# Patient Record
Sex: Female | Born: 1990 | Race: White | Hispanic: No | Marital: Single
Health system: Southern US, Community
[De-identification: ages and names within clinical notes are randomized; demographics above are authoritative.]

## PROBLEM LIST (undated history)

## (undated) ENCOUNTER — Inpatient Hospital Stay (HOSPITAL_COMMUNITY): Payer: Self-pay

## (undated) DIAGNOSIS — B1920 Unspecified viral hepatitis C without hepatic coma: Secondary | ICD-10-CM

---

## 2016-04-30 ENCOUNTER — Inpatient Hospital Stay (HOSPITAL_COMMUNITY)
Admission: AD | Admit: 2016-04-30 | Discharge: 2016-05-01 | Disposition: A | Discharge status: Home / Self Care | Payer: Self-pay | Source: Ambulatory Visit | Attending: Obstetrics & Gynecology | Admitting: Obstetrics & Gynecology

## 2016-04-30 ENCOUNTER — Encounter (HOSPITAL_COMMUNITY): Payer: Self-pay

## 2016-04-30 DIAGNOSIS — N390 Urinary tract infection, site not specified: Secondary | ICD-10-CM

## 2016-04-30 DIAGNOSIS — O4692 Antepartum hemorrhage, unspecified, second trimester: Secondary | ICD-10-CM

## 2016-04-30 DIAGNOSIS — Z3492 Encounter for supervision of normal pregnancy, unspecified, second trimester: Secondary | ICD-10-CM

## 2016-04-30 DIAGNOSIS — F1910 Other psychoactive substance abuse, uncomplicated: Secondary | ICD-10-CM

## 2016-04-30 DIAGNOSIS — Z3491 Encounter for supervision of normal pregnancy, unspecified, first trimester: Secondary | ICD-10-CM

## 2016-04-30 DIAGNOSIS — O209 Hemorrhage in early pregnancy, unspecified: Secondary | ICD-10-CM

## 2016-04-30 DIAGNOSIS — O039 Complete or unspecified spontaneous abortion without complication: Secondary | ICD-10-CM

## 2016-04-30 LAB — POCT PREGNANCY, URINE: Preg Test, Ur: POSITIVE — AB

## 2016-04-30 NOTE — MAU Provider Note (Signed)
History     CSN: [id number]  Arrival date and time: 04/30/16 2340   First Provider Initiated Contact with Patient 04/30/16 2353      Chief Complaint  Patient presents with  . Vaginal Bleeding  . Abdominal Pain   Vaginal Bleeding The patient's primary symptoms include pelvic pain and vaginal bleeding. This is a new problem. The current episode started today. The problem has been gradually worsening. The pain is severe ("12/10"). The problem affects both sides. She is pregnant. Associated symptoms include abdominal pain. Pertinent negatives include no chills, constipation, diarrhea, dysuria, fever, frequency, nausea, urgency or vomiting. The vaginal discharge was bloody. The vaginal bleeding is heavier than menses. She has been passing clots. She has not been passing tissue. Nothing aggravates the symptoms. She has tried nothing for the symptoms. She uses nothing for contraception. Menstrual history: LMP 12/16/15     History reviewed. No pertinent past medical history.  History reviewed. No pertinent past surgical history.  History reviewed. No pertinent family history.  Social History  Substance Use Topics  . Smoking status: None  . Smokeless tobacco: None  . Alcohol Use: None    Allergies: No Known Allergies  No prescriptions prior to admission    Review of Systems  Constitutional: Negative for fever and chills.  Gastrointestinal: Positive for abdominal pain. Negative for nausea, vomiting, diarrhea and constipation.  Genitourinary: Positive for vaginal bleeding and pelvic pain. Negative for dysuria, urgency and frequency.   Physical Exam   Blood pressure 134/81, pulse 62, temperature 98 F (36.7 C), temperature source Oral, resp. rate 20, last menstrual period 12/16/2015, SpO2 100 %.  Physical Exam  Nursing note and vitals reviewed. Constitutional: She is oriented to person, place, and time. She appears well-developed and well-nourished. No distress.  HENT:  Head:  Normocephalic.  Cardiovascular: Normal rate.   Respiratory: Effort normal.  GI: There is no tenderness. There is no rebound.  Genitourinary:   External: no lesion Vagina: large amount of clot in the vagina Cervix: 3/90/BBOW Uterus: AGA  Neurological: She is alert and oriented to person, place, and time.  Skin: Skin is warm and dry.  Psychiatric: She has a normal mood and affect.   [place] Ob Comp Less 14 Wks  05/01/2016  CLINICAL DATA:  First trimester vaginal bleeding. Unsure dates. Negative fetal heart tones. Estimated gestational age by LMP is 19 weeks 4 days. Quantitative beta HCG is pending. EXAM: OBSTETRIC <14 WK US AND TRANSVAGINAL OB US TECHNIQUE: Both transabdominal and transvaginal ultrasound examinations were performed for complete evaluation of the gestation as well as the maternal uterus, adnexal regions, and pelvic cul-de-sac. Transvaginal technique was performed to assess early pregnancy. COMPARISON:  None. FINDINGS: Intrauterine gestational sac: A single intrauterine pregnancy is identified. Yolk sac:  Yolk sac is not visualized. Embryo: Fetal pole is present. Fetal pole appears abnormal with low-attenuation consistent with edema. Amniotic fluid volume is subjectively decreased. Fetus is oriented in the lower uterine segment extending into the cervical canal. Fetal measurements include biparietal diameter of 1.8 cm, head circumference 7.3 cm, femur length 1.2 cm. Based on these measurements, mean gestational age is estimated at 13 weeks 2 days. Cardiac Activity: No fetal cardiac activity is identified. Heart Rate: 0  bpm Maternal uterus/adnexae: Prominent endometrium with fluid and echogenic material present. Right ovary is visualized and appears normal. Left ovary is not visualized. No abnormal adnexal masses. No free fluid identified. IMPRESSION: Single intrauterine pregnancy. Foetal measurements are consistent with estimated gestational age at 6713 weeks  2 days, small for clinical dates.  Fetus is localized to the lower uterine segment and endocervical canal. No fetal motion or cardiac activity are visualized. Appearance is consistent with intrauterine fetal demise. Electronically Signed   By: Burman Nieves M.D.   On: 05/01/2016 01:33   Results for orders placed or performed during the hospital encounter of 04/30/16 (from the past 24 hour(s))  Urinalysis, Routine w reflex microscopic (not at [facility])     Status: Abnormal   Collection Time: 04/30/16 11:40 PM  Result Value Ref Range   Color, Urine YELLOW YELLOW   APPearance CLEAR CLEAR   Specific Gravity, Urine 1.020 1.005 - 1.030   pH 7.5 5.0 - 8.0   Glucose, UA NEGATIVE NEGATIVE mg/dL   Hgb urine dipstick LARGE (A) NEGATIVE   Bilirubin Urine NEGATIVE NEGATIVE   Ketones, ur NEGATIVE NEGATIVE mg/dL   Protein, ur NEGATIVE NEGATIVE mg/dL   Nitrite POSITIVE (A) NEGATIVE   Leukocytes, UA TRACE (A) NEGATIVE  Urine rapid drug screen (hosp performed)     Status: Abnormal   Collection Time: 04/30/16 11:40 PM  Result Value Ref Range   Opiates POSITIVE (A) NONE DETECTED   Cocaine POSITIVE (A) NONE DETECTED   Benzodiazepines NONE DETECTED NONE DETECTED   Amphetamines NONE DETECTED NONE DETECTED   Tetrahydrocannabinol POSITIVE (A) NONE DETECTED   Barbiturates NONE DETECTED NONE DETECTED  Urine microscopic-add on     Status: Abnormal   Collection Time: 04/30/16 11:40 PM  Result Value Ref Range   Squamous Epithelial / LPF 0-5 (A) NONE SEEN   WBC, UA 0-5 0 - 5 WBC/hpf   RBC / HPF 6-30 0 - 5 RBC/hpf   Bacteria, UA MANY (A) NONE SEEN  Pregnancy, urine POC     Status: Abnormal   Collection Time: 04/30/16 11:53 PM  Result Value Ref Range   Preg Test, Ur POSITIVE (A) NEGATIVE  CBC     Status: Abnormal   Collection Time: 05/01/16  3:37 AM  Result Value Ref Range   WBC 11.0 (H) 4.0 - 10.5 K/uL   RBC 3.92 3.87 - 5.11 MIL/uL   Hemoglobin 11.6 (L) 12.0 - 15.0 g/dL   HCT 40.9 (L) 81.1 - 91.4 %   MCV 86.2 78.0 - 100.0 fL   MCH 29.6  26.0 - 34.0 pg   MCHC 34.3 30.0 - 36.0 g/dL   RDW 78.2 95.6 - 21.3 %   Platelets 325 150 - 400 K/uL  ABO/Rh     Status: None (Preliminary result)   Collection Time: 05/01/16  3:37 AM  Result Value Ref Range   ABO/RH(D) O POS     MAU Course  Procedures  MDM 0865: D/W Dr. Despina Hidden, will give of cytotec buccally here in MAU.   0313: Patient has had cytotec.Pelvic exam repeated: External: no lesion Vagina: large amount of tissue and clots removed from the vagina.  Cervix: pink, smooth, once tissue removed bleeding was very light.  Patient is agreeing to having her labs done now.  Tissue sent to pathology.   7846: Patient is O+. Given a dose of toradol prior to dc home.    Assessment and Plan   1. Vaginal bleeding in pregnancy, first trimester   2. Second trimester bleeding   3. Pregnancy with uncertain dates, second trimester   4. Uncertain dates, antepartum, first trimester   5. Substance abuse   6. UTI (lower urinary tract infection)   7. SAB (spontaneous abortion)    DC home Comfort measures  reviewed  Bleeding precautions RX: macrobid bid #[id number]  Return to MAU as needed   Follow-up Information    Follow up with [facility].   Specialty:  Obstetrics and Gynecology   Why:  They will call you with an appointment   Contact information:   [address] [place] [address] [phone]        Tawnya Crook 04/30/2016, 11:54 PM

## 2016-04-30 NOTE — MAU Note (Signed)
Pt presents vis EMS complaining of heavy vaginal bleeding and abdominal pain. States she started spotting a week ago and got heavy tonight. LMP end of february

## 2016-05-01 ENCOUNTER — Encounter (HOSPITAL_COMMUNITY): Payer: Self-pay | Admitting: Anesthesiology

## 2016-05-01 ENCOUNTER — Inpatient Hospital Stay (HOSPITAL_COMMUNITY): Payer: Self-pay

## 2016-05-01 ENCOUNTER — Encounter (HOSPITAL_COMMUNITY): Payer: Self-pay

## 2016-05-01 DIAGNOSIS — O4691 Antepartum hemorrhage, unspecified, first trimester: Secondary | ICD-10-CM

## 2016-05-01 LAB — URINALYSIS, ROUTINE W REFLEX MICROSCOPIC
BILIRUBIN URINE: NEGATIVE
GLUCOSE, UA: NEGATIVE mg/dL
Ketones, ur: NEGATIVE mg/dL
Nitrite: POSITIVE — AB
PROTEIN: NEGATIVE mg/dL
Specific Gravity, Urine: 1.02 (ref 1.005–1.030)
pH: 7.5 (ref 5.0–8.0)

## 2016-05-01 LAB — CBC
HCT: 33.8 % — ABNORMAL LOW (ref 36.0–46.0)
HEMOGLOBIN: 11.6 g/dL — AB (ref 12.0–15.0)
MCH: 29.6 pg (ref 26.0–34.0)
MCHC: 34.3 g/dL (ref 30.0–36.0)
MCV: 86.2 fL (ref 78.0–100.0)
PLATELETS: 325 10*3/uL (ref 150–400)
RBC: 3.92 MIL/uL (ref 3.87–5.11)
RDW: 14.7 % (ref 11.5–15.5)
WBC: 11 10*3/uL — AB (ref 4.0–10.5)

## 2016-05-01 LAB — URINE MICROSCOPIC-ADD ON

## 2016-05-01 LAB — RAPID URINE DRUG SCREEN, HOSP PERFORMED
AMPHETAMINES: NOT DETECTED
Barbiturates: NOT DETECTED
Benzodiazepines: NOT DETECTED
Cocaine: POSITIVE — AB
OPIATES: POSITIVE — AB
Tetrahydrocannabinol: POSITIVE — AB

## 2016-05-01 LAB — ABO/RH: ABO/RH(D): O POS

## 2016-05-01 LAB — HCG, QUANTITATIVE, PREGNANCY: hCG, Beta Chain, Quant, S: 418 m[IU]/mL — ABNORMAL HIGH (ref <5)

## 2016-05-01 MED ORDER — NITROFURANTOIN MONOHYD MACRO 100 MG PO CAPS: 100.0000 mg | ORAL_CAPSULE | Freq: Two times a day (BID) | ORAL | Status: AC

## 2016-05-01 MED ORDER — KETOROLAC TROMETHAMINE 60 MG/2ML IM SOLN
60.0000 mg | Freq: Once | INTRAMUSCULAR | Status: AC
  Administered 2016-05-01: 60 mg via INTRAMUSCULAR
  Filled 2016-05-01: qty 2

## 2016-05-01 MED ORDER — HYDROMORPHONE HCL 1 MG/ML IJ SOLN
2.0000 mg | Freq: Once | INTRAMUSCULAR | Status: AC
  Administered 2016-05-01: 2 mg via INTRAMUSCULAR
  Filled 2016-05-01 (×2): qty 2

## 2016-05-01 MED ORDER — MISOPROSTOL 200 MCG PO TABS
800.0000 ug | ORAL_TABLET | Freq: Once | ORAL | Status: AC
  Administered 2016-05-01: 800 ug via BUCCAL
  Filled 2016-05-01: qty 4

## 2016-05-01 NOTE — Discharge Instructions (Signed)
Urinary Tract Infection Urinary tract infections (UTIs) can develop anywhere along your urinary tract. Your urinary tract is your body's drainage system for removing wastes and extra water. Your urinary tract includes two kidneys, two ureters, a bladder, and a urethra. Your kidneys are a pair of bean-shaped organs. Each kidney is about the size of your fist. They are located below your ribs, one on each side of your spine. CAUSES Infections are caused by microbes, which are microscopic organisms, including fungi, viruses, and bacteria. These organisms are so small that they can only be seen through a microscope. Bacteria are the microbes that most commonly cause UTIs. SYMPTOMS  Symptoms of UTIs may vary by age and gender of the patient and by the location of the infection. Symptoms in young women typically include a frequent and intense urge to urinate and a painful, burning feeling in the bladder or urethra during urination. Older women and men are more likely to be tired, shaky, and weak and have muscle aches and abdominal pain. A fever may mean the infection is in your kidneys. Other symptoms of a kidney infection include pain in your back or sides below the ribs, nausea, and vomiting. DIAGNOSIS To diagnose a UTI, your caregiver will ask you about your symptoms. Your caregiver will also ask you to provide a urine sample. The urine sample will be tested for bacteria and white blood cells. White blood cells are made by your body to help fight infection. TREATMENT  Typically, UTIs can be treated with medication. Because most UTIs are caused by a bacterial infection, they usually can be treated with the use of antibiotics. The choice of antibiotic and length of treatment depend on your symptoms and the type of bacteria causing your infection. HOME CARE INSTRUCTIONS  If you were prescribed antibiotics, take them exactly as your caregiver instructs you. Finish the medication even if you feel better after  you have only taken some of the medication.  Drink enough water and fluids to keep your urine clear or pale yellow.  Avoid caffeine, tea, and carbonated beverages. They tend to irritate your bladder.  Empty your bladder often. Avoid holding urine for long periods of time.  Empty your bladder before and after sexual intercourse.  After a bowel movement, women should cleanse from front to back. Use each tissue only once. SEEK MEDICAL CARE IF:   You have back pain.  You develop a fever.  Your symptoms do not begin to resolve within 3 days. SEEK IMMEDIATE MEDICAL CARE IF:   You have severe back pain or lower abdominal pain.  You develop chills.  You have nausea or vomiting.  You have continued burning or discomfort with urination. MAKE SURE YOU:   Understand these instructions.  Will watch your condition.  Will get help right away if you are not doing well or get worse.   This information is not intended to replace advice given to you by your health care provider. Make sure you discuss any questions you have with your health care provider.   Document Released: 07/16/2005 Document Revised: 06/27/2015 Document Reviewed: 11/14/2011 Elsevier Interactive Patient Education [employer]  Miscarriage A miscarriage is the sudden loss of an unborn baby (fetus) before the 20th week of pregnancy. Most miscarriages happen in the first 3 months of pregnancy. Sometimes, it happens before a woman even knows she is pregnant. A miscarriage is also called a "spontaneous miscarriage" or "early pregnancy loss." Having a miscarriage can be an emotional experience. Talk  with your caregiver about any questions you may have about miscarrying, the grieving process, and your future pregnancy plans. CAUSES   Problems with the fetal chromosomes that make it impossible for the baby to develop normally. Problems with the baby's genes or chromosomes are most often the result of errors that occur, by  chance, as the embryo divides and grows. The problems are not inherited from the parents.  Infection of the cervix or uterus.   Hormone problems.   Problems with the cervix, such as having an incompetent cervix. This is when the tissue in the cervix is not strong enough to hold the pregnancy.   Problems with the uterus, such as an abnormally shaped uterus, uterine fibroids, or congenital abnormalities.   Certain medical conditions.   Smoking, drinking alcohol, or taking illegal drugs.   Trauma.  Often, the cause of a miscarriage is unknown.  SYMPTOMS   Vaginal bleeding or spotting, with or without cramps or pain.  Pain or cramping in the abdomen or lower back.  Passing fluid, tissue, or blood clots from the vagina. DIAGNOSIS  Your caregiver will perform a physical exam. You may also have an ultrasound to confirm the miscarriage. Blood or urine tests may also be ordered. TREATMENT   Sometimes, treatment is not necessary if you naturally pass all the fetal tissue that was in the uterus. If some of the fetus or placenta remains in the body (incomplete miscarriage), tissue left behind may become infected and must be removed. Usually, a dilation and curettage (D and C) procedure is performed. During a D and C procedure, the cervix is widened (dilated) and any remaining fetal or placental tissue is gently removed from the uterus.  Antibiotic medicines are prescribed if there is an infection. Other medicines may be given to reduce the size of the uterus (contract) if there is a lot of bleeding.  If you have Rh negative blood and your baby was Rh positive, you will need a Rh immunoglobulin shot. This shot will protect any future baby from having Rh blood problems in future pregnancies. HOME CARE INSTRUCTIONS   Your caregiver may order bed rest or may allow you to continue light activity. Resume activity as directed by your caregiver.  Have someone help with home and family  responsibilities during this time.   Keep track of the number of sanitary pads you use each day and how soaked (saturated) they are. Write down this information.   Do not use tampons. Do not douche or have sexual intercourse until approved by your caregiver.   Only take over-the-counter or prescription medicines for pain or discomfort as directed by your caregiver.   Do not take aspirin. Aspirin can cause bleeding.   Keep all follow-up appointments with your caregiver.   If you or your partner have problems with grieving, talk to your caregiver or seek counseling to help cope with the pregnancy loss. Allow enough time to grieve before trying to get pregnant again.  SEEK IMMEDIATE MEDICAL CARE IF:   You have severe cramps or pain in your back or abdomen.  You have a fever.  You pass large blood clots (walnut-sized or larger) ortissue from your vagina. Save any tissue for your caregiver to inspect.   Your bleeding increases.   You have a thick, bad-smelling vaginal discharge.  You become lightheaded, weak, or you faint.   You have chills.  MAKE SURE YOU:  Understand these instructions.  Will watch your condition.  Will get help right  away if you are not doing well or get worse.   This information is not intended to replace advice given to you by your health care provider. Make sure you discuss any questions you have with your health care provider.   Document Released: 04/01/2001 Document Revised: 01/31/2013 Document Reviewed: 11/25/2011 Elsevier Interactive Patient Education [employer]

## 2016-05-01 NOTE — MAU Note (Signed)
Pt refused blood work per phlebotomist. States we have already stuck the only good vein she has and she does not want to be stuck again. CNM notified

## 2016-05-03 LAB — URINE CULTURE

## 2016-05-27 ENCOUNTER — Encounter: Payer: Self-pay | Admitting: Medical

## 2017-03-06 ENCOUNTER — Encounter (HOSPITAL_COMMUNITY): Payer: Self-pay

## 2017-05-09 ENCOUNTER — Encounter (HOSPITAL_COMMUNITY): Payer: Self-pay | Admitting: Emergency Medicine

## 2017-05-09 ENCOUNTER — Emergency Department (HOSPITAL_COMMUNITY): Payer: Self-pay

## 2017-05-09 ENCOUNTER — Emergency Department (HOSPITAL_COMMUNITY)
Admission: EM | Admit: 2017-05-09 | Discharge: 2017-05-09 | Disposition: A | Discharge status: Home / Self Care | Payer: Self-pay | Attending: Emergency Medicine | Admitting: Emergency Medicine

## 2017-05-09 DIAGNOSIS — F17200 Nicotine dependence, unspecified, uncomplicated: Secondary | ICD-10-CM | POA: Insufficient documentation

## 2017-05-09 DIAGNOSIS — F1910 Other psychoactive substance abuse, uncomplicated: Secondary | ICD-10-CM | POA: Insufficient documentation

## 2017-05-09 DIAGNOSIS — Z79899 Other long term (current) drug therapy: Secondary | ICD-10-CM | POA: Insufficient documentation

## 2017-05-09 LAB — I-STAT BETA HCG BLOOD, ED (MC, WL, AP ONLY): I-stat hCG, quantitative: <5 m[IU]/mL (ref <5)

## 2017-05-09 LAB — CBC WITH DIFFERENTIAL/PLATELET
BASOS PCT: 0 %
Basophils Absolute: 0 10*3/uL (ref 0.0–0.1)
Eosinophils Absolute: 0.5 10*3/uL (ref 0.0–0.7)
Eosinophils Relative: 6 %
HEMATOCRIT: 39 % (ref 36.0–46.0)
Hemoglobin: 12.8 g/dL (ref 12.0–15.0)
Lymphocytes Relative: 47 %
Lymphs Abs: 3.4 10*3/uL (ref 0.7–4.0)
MCH: 29.2 pg (ref 26.0–34.0)
MCHC: 32.8 g/dL (ref 30.0–36.0)
MCV: 89 fL (ref 78.0–100.0)
MONOS PCT: 8 %
Monocytes Absolute: 0.6 10*3/uL (ref 0.1–1.0)
NEUTROS ABS: 2.9 10*3/uL (ref 1.7–7.7)
Neutrophils Relative %: 39 %
PLATELETS: 251 10*3/uL (ref 150–400)
RBC: 4.38 MIL/uL (ref 3.87–5.11)
RDW: 13.9 % (ref 11.5–15.5)
WBC: 7.4 10*3/uL (ref 4.0–10.5)

## 2017-05-09 LAB — COMPREHENSIVE METABOLIC PANEL
ALBUMIN: 3.5 g/dL (ref 3.5–5.0)
ALT: 18 U/L (ref 14–54)
ANION GAP: 8 (ref 5–15)
AST: 27 U/L (ref 15–41)
Alkaline Phosphatase: 56 U/L (ref 38–126)
BUN: 13 mg/dL (ref 6–20)
CHLORIDE: 105 mmol/L (ref 101–111)
CO2: 27 mmol/L (ref 22–32)
Calcium: 8.9 mg/dL (ref 8.9–10.3)
Creatinine, Ser: 0.67 mg/dL (ref 0.44–1.00)
GFR calc non Af Amer: >60 mL/min (ref >60)
GLUCOSE: 98 mg/dL (ref 65–99)
POTASSIUM: 3.8 mmol/L (ref 3.5–5.1)
SODIUM: 140 mmol/L (ref 135–145)
Total Bilirubin: 0.4 mg/dL (ref 0.3–1.2)
Total Protein: 6.5 g/dL (ref 6.5–8.1)

## 2017-05-09 LAB — RAPID URINE DRUG SCREEN, HOSP PERFORMED
AMPHETAMINES: NOT DETECTED
BENZODIAZEPINES: NOT DETECTED
Barbiturates: NOT DETECTED
Cocaine: POSITIVE — AB
OPIATES: POSITIVE — AB
TETRAHYDROCANNABINOL: POSITIVE — AB

## 2017-05-09 LAB — ETHANOL: Alcohol, Ethyl (B): <5 mg/dL (ref <5)

## 2017-05-09 LAB — ACETAMINOPHEN LEVEL

## 2017-05-09 MED ORDER — NALOXONE HCL 2 MG/2ML IJ SOSY
2.0000 mg | PREFILLED_SYRINGE | Freq: Once | INTRAMUSCULAR | Status: AC
  Administered 2017-05-09: 2 mg via INTRAMUSCULAR
  Filled 2017-05-09: qty 2

## 2017-05-09 MED ORDER — NALOXONE HCL 0.4 MG/ML IJ SOLN
0.4000 mg | Freq: Once | INTRAMUSCULAR | Status: AC
  Administered 2017-05-09: 0.4 mg via INTRAVENOUS
  Filled 2017-05-09: qty 1

## 2017-05-09 NOTE — ED Notes (Signed)
Pt is arousable to tactile stimuli but doesn't wake up enough to speak.  Doesn't appear to be in distress.  On cardiac monitor.

## 2017-05-09 NOTE — ED Notes (Signed)
Lab states acetaminophen can be run off the blood they have down there.

## 2017-05-09 NOTE — ED Triage Notes (Signed)
Per [facility] , escorted by [employer] , pt. From jail who was reported acting bizzare. Pt. Was initially picked up from a gas station at 2am today and was reported of using herroin and cocaine , pt. Denied all these. Pt. Uncooperative  , unable to obtain V/S.

## 2017-05-09 NOTE — ED Provider Notes (Signed)
[facility] Provider Note: Lowella DellJ. Lane Gudelia Eugene, MD, FACEP  CSN: [id number] MRN: [id number] ARRIVAL: 05/09/17 at 0425 ROOM: WA23/WA23   CHIEF COMPLAINT  Medical Clearance  Level V caveat: Altered mental status HISTORY OF PRESENT ILLNESS  Candice Barton is a 26 y.o. female with a history of polysubstance abuse and has been on a suspected heroin and cocaine and/or methamphetamine binge. She was arrested yesterday afternoon but began acting bizarrely in jail this morning and was sent to the ED. She was able to walk to the ambulance on her own. She arrived here crying and uncooperative and has since become somnolent.   History reviewed. No pertinent past medical history.  History reviewed. No pertinent surgical history.  History reviewed. No pertinent family history.  Social History  Substance Use Topics  . Smoking status: Current Every Day Smoker  . Smokeless tobacco: Not on file  . Alcohol use Yes    Prior to Admission medications   Medication Sig Start Date End Date Taking? Authorizing Provider  nitrofurantoin, macrocrystal-monohydrate, (MACROBID) 100 MG capsule Take 1 capsule (100 mg total) by mouth 2 (two) times daily. 05/01/16   Armando ReichertHogan, Heather D, CNM    Allergies Patient has no known allergies.   REVIEW OF SYSTEMS     PHYSICAL EXAMINATION  Initial Vital Signs Blood pressure (!) 119/56, pulse 67, temperature (!) 97.5 F (36.4 C), temperature source Oral, resp. rate 18, SpO2 96 %, unknown if currently breastfeeding.  Examination General: Well-developed, well-nourished female in no acute distress; appearance consistent with age of record HENT: normocephalic; atraumatic Eyes: pupils equal, round and reactive to light Neck: supple Heart: regular rate and rhythm Lungs: clear to auscultation bilaterally Abdomen: soft; nondistended; no masses or hepatosplenomegaly; bowel sounds present Extremities: No deformity; full range of motion; pulses normal Neurologic:  Somnolent; will move extremities to noxious stimuli Skin: Warm and dry    RESULTS  Summary of this visit's results, reviewed by myself:   EKG Interpretation  Date/Time:  Saturday May 09 2017 04:59:42 EDT Ventricular Rate:  50 PR Interval:    QRS Duration: 76 QT Interval:  442 QTC Calculation: 403 R Axis:   72 Text Interpretation:  Ectopic atrial rhythm RSR' in V1 or V2, probably normal variant ST elev, probable normal early repol pattern No previous ECGs available Confirmed by Paula LibraMolpus, Oscar Hank ([id number]) on 05/09/2017 5:10:30 AM      Laboratory Studies: Results for orders placed or performed during the hospital encounter of 05/09/17 (from the past 24 hour(s))  Comprehensive metabolic panel     Status: None   Collection Time: 05/09/17  4:37 AM  Result Value Ref Range   Sodium 140 135 - 145 mmol/L   Potassium 3.8 3.5 - 5.1 mmol/L   Chloride 105 101 - 111 mmol/L   CO2 27 22 - 32 mmol/L   Glucose, Bld 98 65 - 99 mg/dL   BUN 13 6 - 20 mg/dL   Creatinine, Ser 6.570.67 0.44 - 1.00 mg/dL   Calcium 8.9 8.9 - 84.610.3 mg/dL   Total Protein 6.5 6.5 - 8.1 g/dL   Albumin 3.5 3.5 - 5.0 g/dL   AST 27 15 - 41 U/L   ALT 18 14 - 54 U/L   Alkaline Phosphatase 56 38 - 126 U/L   Total Bilirubin 0.4 0.3 - 1.2 mg/dL   GFR calc non Af Amer >60 >60 mL/min   GFR calc Af Amer >60 >60 mL/min   Anion gap 8 5 - 15  CBC with Diff  Status: None   Collection Time: 05/09/17  4:37 AM  Result Value Ref Range   WBC 7.4 4.0 - 10.5 K/uL   RBC 4.38 3.87 - 5.11 MIL/uL   Hemoglobin 12.8 12.0 - 15.0 g/dL   HCT 40.9 81.1 - 91.4 %   MCV 89.0 78.0 - 100.0 fL   MCH 29.2 26.0 - 34.0 pg   MCHC 32.8 30.0 - 36.0 g/dL   RDW 78.2 95.6 - 21.3 %   Platelets 251 150 - 400 K/uL   Neutrophils Relative % 39 %   Neutro Abs 2.9 1.7 - 7.7 K/uL   Lymphocytes Relative 47 %   Lymphs Abs 3.4 0.7 - 4.0 K/uL   Monocytes Relative 8 %   Monocytes Absolute 0.6 0.1 - 1.0 K/uL   Eosinophils Relative 6 %   Eosinophils Absolute 0.5 0.0 -  0.7 K/uL   Basophils Relative 0 %   Basophils Absolute 0.0 0.0 - 0.1 K/uL  Ethanol     Status: None   Collection Time: 05/09/17  5:28 AM  Result Value Ref Range   Alcohol, Ethyl (B) <5 <5 mg/dL  Acetaminophen level     Status: Abnormal   Collection Time: 05/09/17  5:30 AM  Result Value Ref Range   Acetaminophen (Tylenol), Serum <10 (L) 10 - 30 ug/mL  I-Stat beta hCG blood, ED     Status: None   Collection Time: 05/09/17  5:33 AM  Result Value Ref Range   I-stat hCG, quantitative <5.0 <5 mIU/mL   Comment 3          Urine rapid drug screen (hosp performed)     Status: Abnormal   Collection Time: 05/09/17  5:47 AM  Result Value Ref Range   Opiates POSITIVE (A) NONE DETECTED   Cocaine POSITIVE (A) NONE DETECTED   Benzodiazepines NONE DETECTED NONE DETECTED   Amphetamines NONE DETECTED NONE DETECTED   Tetrahydrocannabinol POSITIVE (A) NONE DETECTED   Barbiturates NONE DETECTED NONE DETECTED   Imaging Studies: Dg Chest Portable 2 Views  Result Date: 05/09/2017 CLINICAL DATA:  Cough.  Agitation. EXAM: CHEST  2 VIEW PORTABLE COMPARISON:  None. FINDINGS: Mildly low lung volumes. There is no edema, consolidation, effusion, or pneumothorax. Normal heart size and mediastinal contours for technique. No osseous findings. IMPRESSION: Negative portable chest. Electronically Signed   By: Marnee Spring M.D.   On: 05/09/2017 10:11    ED COURSE  Nursing notes and initial vitals signs, including pulse oximetry, reviewed.  Vitals:   05/09/17 0930 05/09/17 0933 05/09/17 0945 05/09/17 0959  BP: 95/65  (!) 82/58 104/75  Pulse: 71 70 (!) 56 (!) 108  Resp: (!) 24 (!) 27 17 18   Temp:      TempSrc:      SpO2: 90% 90% 92% 100%   7:33 AM Patient checked out to Dr. Fayrene Fearing.  PROCEDURES    ED DIAGNOSES     ICD-10-CM   1. Polysubstance abuse F19.10        Alysen Smylie, MD 05/09/17 2234

## 2017-05-09 NOTE — Discharge Instructions (Signed)
Consider not doing narcotics.Substance Abuse Treatment Programs  Intensive Outpatient Programs [facility]     [address]      [place], [place]                   [phone]       [facility] [address] [place] [address] [place], [place] [phone]  [facility]     (Inpatient and outpatient)     [address]           [phone]    [facility] [phone] (Suboxone and Methadone)  [address]      [place], [place] [address]      [phone]       [address] [place], [place] [phone]  [facility] (Outpatient/Inpatient, Chemical)    (insurance only) [phone]             [facility] (Groups & Residential) [place], [place] [phone]     [facility]     [address]     [place], [place]      [phone]       [facility] (for caregivers and family) [phone] [address]ll [address] [place], [place] [phone]      Residential Treatment Programs [facility]      [address] [place], [place] [address]  [phone]       [facility] [address], [place], [place] [address] [phone]  [facility] [place]        [phone]       [facility] [phone]  [facility] ([facility]             [address]                                         [place], [place]                                                [phone] or [phone]                               [facility] [address] [place], [address] [phone]  [facility]    [address]      [place], [place]     [phone]       [facility] [address] [place], [place] [phone]  [facility]   [address] [place], [place] [address]     [phone]      Admissions: 8am-3pm M-F  [facility] (RTS) [address] [place], [place] [phone]  [facility]: Residential Program [phone] Days)   [place], [place]      [phone] or [phone]     [facility]: [facility] [place], [place] (Walk in Hours over the weekend or by referral)  [facility] [address] [place], [place], [place] [address] [phone]  Crisis Mobile: [facility] Altern[facility]:  [phone] (for crisis response 24 hours a day) [facility] Hotline:      [phone] Outpatient Psychiatry and Counseling  Therapeutic  Alternatives: Mobile Crisis Management 24 hours:  [phone]  [facility] sliding scale fee and walk in schedule: M-F 8am-12pm/1pm-3pm [address]  [place], [place] [address] [phone]  [facility] [address] [place], [place] [address] [phone]  [facility] ([facility]ly [facility])- new patient walk-in appointments available Monday - Friday 8am -3pm.          [address] [place], [place] [address] [phone] or crisis line- [phone]  [facility] Out[facility] Services/ Intensive Outpatient Therapy Program [address] [place], [place] [address] [phone]  [facility]                  Crisis Services      [phone]      [address]     [place], [place] [address]                 [facility]   [facility] [phone] [address] [place], [place] [address]   [facility]          [address] [place],  [place], [place] [address]       [phone]  [facility] [address] [place], [place] [address] [phone]  [facility]    [address]    [place], [place] [address]     [phone]       Andee PolesParish McKinney, MD     [address]     [place] [address]     [phone]       [facility] [address] [place] [address] [place] [address]  [facility]     [address]     [place], [place]      [phone]       [facility] Eulogio DitchShamsher Ahluwalia, MD [address] [place], [place] [address] [phone]  [facility]     [address]     [place], [place] [address]     [phone]       [facility] [address] [place], [place] [address] [phone] Resources for Temporary Residential Assistance/Crisis Centers  [facility]ENT[facility](IRC) M-F 8am-3pm   [address] [place], [place] [address]   [phone] Services include: laundry, barbering, support groups, case management, phone  & computer access, showers, AA/NA mtgs, mental health/substance abuse nurse, job skills class, disability information, VA assistance, spiritual classes, etc.   HOMELESS SHELTERS  [facility]     [facility]   [address], [place]     [phone]              [facility] (women and children)       [address] [place], [place] [address] [phone] [email or web address] for application and process Application Required  [facility]   [address]    [place] [address]     [phone]                    [facility] [address] [place], [place] [address] [phone](schedule application appt.) Application Required  [facility] (women only)    [address]     [place], [place] [address]     [phone]  Intake starts 6pm daily Need valid ID, SSC, & Police report [facility] [place] [address] [place], [place] [phone] Application Required  [facility] (men only)     [address]      [place], [place]     [phone]       [facility] [place] (Pregnant women only) [address] [place],  [place] [phone]  [facility]      [address]      [place], [place] [address]     [phone]             [facility] [address] [place], [place] [phone] 90 day commitment/SA/Application process  [facility](men only)     [address]     [place], [place]     [phone]       Check-in at [facility]            C[facility]is [facility] [address] [place], [place] [address] [phone] Men/Women/Women and Children must be there by 7 pm  [facility] [place], [place] [phone]

## 2017-05-09 NOTE — ED Notes (Signed)
Pt is not able to ambulate.  She is arousable only to tactile stimuli enough to grunt and move a bit.  On monitor.  [employer] at bedside.

## 2017-05-09 NOTE — ED Notes (Addendum)
Pt has woken up now.  She is figety, cussing.  Walked to bathroom stating she had to "shit and pee" and that we'd better hurry or sh'll do it in the trash can.

## 2017-05-09 NOTE — ED Notes (Signed)
Pt is now awake and fidgety, cursing.  [employer] at bedside.
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
## 2017-05-09 NOTE — ED Provider Notes (Signed)
Pt evaluated on multiple occasions. Required Narcan rescue at 08:00. Additional obs. Time. Pt continues to sleep, but awakens to voice. State that she is a heroin addict, and last used at noon yesterday. Acetaminophen levels normal.   Re-evaluated at 12:15pm. Still awakens to voice.  Ambulatory. Given Narcan 2mg  IM prior to DC into police custody.    Rolland PorterJames, Candice Besse, MD 05/09/17 1225

## 2018-07-23 ENCOUNTER — Emergency Department (HOSPITAL_COMMUNITY)
Admission: EM | Admit: 2018-07-23 | Discharge: 2018-07-23 | Payer: Self-pay | Attending: Emergency Medicine | Admitting: Emergency Medicine

## 2018-07-23 ENCOUNTER — Encounter (HOSPITAL_COMMUNITY): Payer: Self-pay | Admitting: Emergency Medicine

## 2018-07-23 DIAGNOSIS — F17210 Nicotine dependence, cigarettes, uncomplicated: Secondary | ICD-10-CM | POA: Insufficient documentation

## 2018-07-23 DIAGNOSIS — Z5320 Procedure and treatment not carried out because of patient's decision for unspecified reasons: Secondary | ICD-10-CM | POA: Insufficient documentation

## 2018-07-23 DIAGNOSIS — T401X1A Poisoning by heroin, accidental (unintentional), initial encounter: Secondary | ICD-10-CM | POA: Insufficient documentation

## 2018-07-23 LAB — RAPID URINE DRUG SCREEN, HOSP PERFORMED
AMPHETAMINES: NOT DETECTED
BENZODIAZEPINES: POSITIVE — AB
Barbiturates: POSITIVE — AB
Cocaine: NOT DETECTED
OPIATES: POSITIVE — AB
Tetrahydrocannabinol: POSITIVE — AB
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
## 2018-07-23 NOTE — Progress Notes (Signed)
07/23/18  1320  Patient was seen by MD. Pt refused labs. Pt refused care and wants to be transported to jail.

## 2018-07-23 NOTE — ED Triage Notes (Signed)
Patient here from home with complaints of SI. Reports that she has a plan to OD on heroin. Reports she did today and was given Narcan by fire, not sure how much. Ambulatory, talking, asking for water.

## 2019-12-16 ENCOUNTER — Other Ambulatory Visit: Payer: Self-pay

## 2019-12-16 ENCOUNTER — Emergency Department (HOSPITAL_COMMUNITY): Payer: Self-pay

## 2019-12-16 ENCOUNTER — Emergency Department (HOSPITAL_COMMUNITY)
Admission: EM | Admit: 2019-12-16 | Discharge: 2019-12-16 | Disposition: A | Discharge status: Home / Self Care | Payer: Self-pay | Attending: Emergency Medicine | Admitting: Emergency Medicine

## 2019-12-16 DIAGNOSIS — R52 Pain, unspecified: Secondary | ICD-10-CM

## 2019-12-16 DIAGNOSIS — F1910 Other psychoactive substance abuse, uncomplicated: Secondary | ICD-10-CM | POA: Insufficient documentation

## 2019-12-16 DIAGNOSIS — U071 COVID-19: Secondary | ICD-10-CM | POA: Insufficient documentation

## 2019-12-16 DIAGNOSIS — M546 Pain in thoracic spine: Secondary | ICD-10-CM | POA: Insufficient documentation

## 2019-12-16 LAB — COMPREHENSIVE METABOLIC PANEL
ALT: 16 U/L (ref 0–44)
AST: 27 U/L (ref 15–41)
Albumin: 4.3 g/dL (ref 3.5–5.0)
Alkaline Phosphatase: 53 U/L (ref 38–126)
Anion gap: 11 (ref 5–15)
BUN: 13 mg/dL (ref 6–20)
CO2: 24 mmol/L (ref 22–32)
Calcium: 9.5 mg/dL (ref 8.9–10.3)
Chloride: 105 mmol/L (ref 98–111)
Creatinine, Ser: 0.84 mg/dL (ref 0.44–1.00)
GFR calc Af Amer: >60 mL/min (ref >60)
GFR calc non Af Amer: >60 mL/min (ref >60)
Glucose, Bld: 89 mg/dL (ref 70–99)
Potassium: 3.9 mmol/L (ref 3.5–5.1)
Sodium: 140 mmol/L (ref 135–145)
Total Bilirubin: 0.6 mg/dL (ref 0.3–1.2)
Total Protein: 8 g/dL (ref 6.5–8.1)

## 2019-12-16 LAB — RAPID URINE DRUG SCREEN, HOSP PERFORMED
Amphetamines: POSITIVE — AB
Barbiturates: NOT DETECTED
Benzodiazepines: NOT DETECTED
Cocaine: POSITIVE — AB
Opiates: NOT DETECTED
Tetrahydrocannabinol: POSITIVE — AB

## 2019-12-16 LAB — I-STAT BETA HCG BLOOD, ED (MC, WL, AP ONLY): I-stat hCG, quantitative: <5 m[IU]/mL (ref <5)

## 2019-12-16 LAB — CBC
HCT: 42.3 % (ref 36.0–46.0)
Hemoglobin: 13.7 g/dL (ref 12.0–15.0)
MCH: 28.1 pg (ref 26.0–34.0)
MCHC: 32.4 g/dL (ref 30.0–36.0)
MCV: 86.9 fL (ref 80.0–100.0)
Platelets: 246 10*3/uL (ref 150–400)
RBC: 4.87 MIL/uL (ref 3.87–5.11)
RDW: 14 % (ref 11.5–15.5)
WBC: 4.9 10*3/uL (ref 4.0–10.5)
nRBC: 0 % (ref 0.0–0.2)

## 2019-12-16 LAB — RESPIRATORY PANEL BY RT PCR (FLU A&B, COVID)
Influenza A by PCR: NEGATIVE
Influenza B by PCR: NEGATIVE
SARS Coronavirus 2 by RT PCR: POSITIVE — AB

## 2019-12-16 LAB — ETHANOL: Alcohol, Ethyl (B): <10 mg/dL (ref <10)

## 2019-12-16 LAB — SALICYLATE LEVEL: Salicylate Lvl: <7 mg/dL — ABNORMAL LOW (ref 7.0–30.0)

## 2019-12-16 LAB — POC SARS CORONAVIRUS 2 AG -  ED: SARS Coronavirus 2 Ag: NEGATIVE

## 2019-12-16 LAB — ACETAMINOPHEN LEVEL: Acetaminophen (Tylenol), Serum: <10 ug/mL — ABNORMAL LOW (ref 10–30)

## 2019-12-16 MED ORDER — LORAZEPAM 1 MG PO TABS
2.0000 mg | ORAL_TABLET | Freq: Once | ORAL | Status: DC
  Filled 2019-12-16: qty 2

## 2019-12-16 NOTE — ED Notes (Signed)
Patient has been wanded by [facility] ED Security. 
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
## 2019-12-16 NOTE — ED Notes (Signed)
Upon initial introduction to Pt. She nodded off several times and had to be awakened. She was placed on the pulse ox and showed to be a 100% on RA. She was then swabbed for covid and has remained alert but very emotional since then. Pt will eventually follow commands.

## 2019-12-16 NOTE — Discharge Instructions (Addendum)
You tested positive for COVID-19. Please self quarantine.  Turn immediately for new or worsening symptoms or concerns, such as chest pain, shortness of breath, uncontrolled vomiting or any concerns at all.  You were offered further evaluation today by psychology.  However you declined evaluation by psychology.  You may return at any time for further evaluation.  Please return immediately for new or worsening symptoms or concerns, such as suicidal thoughts, homicidal thoughts, hallucinations or any concerns at all.

## 2019-12-16 NOTE — ED Notes (Signed)
Pt changed into purple scrubs and put all belongings in pt belonging bags.

## 2019-12-16 NOTE — ED Notes (Signed)
Patient ambulated to bathroom with steady gait. Pt is aware that a urine sample is needed and is currently attempting to provide one. This NT provided the pt with a set of purple scrubs to change in to as well as belonging bags so that she may put her belongings in them.

## 2020-10-31 ENCOUNTER — Other Ambulatory Visit: Payer: Self-pay

## 2020-10-31 ENCOUNTER — Emergency Department (HOSPITAL_COMMUNITY)
Admission: EM | Admit: 2020-10-31 | Discharge: 2020-10-31 | Disposition: A | Discharge status: Home / Self Care | Payer: Self-pay | Attending: Emergency Medicine | Admitting: Emergency Medicine

## 2020-10-31 ENCOUNTER — Emergency Department (HOSPITAL_COMMUNITY): Payer: Self-pay

## 2020-10-31 ENCOUNTER — Encounter (HOSPITAL_COMMUNITY): Payer: Self-pay | Admitting: Emergency Medicine

## 2020-10-31 DIAGNOSIS — F1910 Other psychoactive substance abuse, uncomplicated: Secondary | ICD-10-CM | POA: Insufficient documentation

## 2020-10-31 DIAGNOSIS — F17200 Nicotine dependence, unspecified, uncomplicated: Secondary | ICD-10-CM | POA: Insufficient documentation

## 2020-10-31 LAB — BASIC METABOLIC PANEL
Anion gap: 10 (ref 5–15)
BUN: 8 mg/dL (ref 6–20)
CO2: 29 mmol/L (ref 22–32)
Calcium: 9.2 mg/dL (ref 8.9–10.3)
Chloride: 100 mmol/L (ref 98–111)
Creatinine, Ser: 0.71 mg/dL (ref 0.44–1.00)
GFR, Estimated: >60 mL/min (ref >60)
Glucose, Bld: 100 mg/dL — ABNORMAL HIGH (ref 70–99)
Potassium: 3.4 mmol/L — ABNORMAL LOW (ref 3.5–5.1)
Sodium: 139 mmol/L (ref 135–145)

## 2020-10-31 LAB — CBC
HCT: 40.1 % (ref 36.0–46.0)
Hemoglobin: 12.6 g/dL (ref 12.0–15.0)
MCH: 28.6 pg (ref 26.0–34.0)
MCHC: 31.4 g/dL (ref 30.0–36.0)
MCV: 90.9 fL (ref 80.0–100.0)
Platelets: 248 10*3/uL (ref 150–400)
RBC: 4.41 MIL/uL (ref 3.87–5.11)
RDW: 13.3 % (ref 11.5–15.5)
WBC: 6 10*3/uL (ref 4.0–10.5)
nRBC: 0 % (ref 0.0–0.2)

## 2020-10-31 LAB — I-STAT BETA HCG BLOOD, ED (MC, WL, AP ONLY): I-stat hCG, quantitative: <5 m[IU]/mL (ref <5)

## 2020-10-31 LAB — TROPONIN I (HIGH SENSITIVITY): Troponin I (High Sensitivity): 2 ng/L (ref <18)

## 2020-10-31 NOTE — ED Provider Notes (Signed)
[facility] EMERGENCY DEPARTMENT Provider Note   CSN: [id number] Arrival date & time: 10/31/20  0051     History Chief Complaint  Patient presents with  . Chest Pain    Candice Barton is a 30 y.o. female.  30 yo F with a chief complaint of chest pain. The patient is too drowsy to obtain a history level 5 caveat.  The history is provided by the patient.  Chest Pain      History reviewed. No pertinent past medical history.  There are no problems to display for this patient.   History reviewed. No pertinent surgical history.   OB History    Gravida  4   Para  1   Term  1   Preterm      AB  2   Living  1     SAB  1   IAB      Ectopic      Multiple      Live Births  1           No family history on file.  Social History   Tobacco Use  . Smoking status: Current Every Day Smoker  . Smokeless tobacco: Never Used  Substance Use Topics  . Alcohol use: Yes  . Drug use: Yes    Types: Heroin, IV, Cocaine    Home Medications Prior to Admission medications   Medication Sig Start Date End Date Taking? Authorizing Provider  nitrofurantoin, macrocrystal-monohydrate, (MACROBID) 100 MG capsule Take 1 capsule (100 mg total) by mouth 2 (two) times daily. 05/01/16   Armando Reichert, CNM    Allergies    Patient has no known allergies.  Review of Systems   Review of Systems  Unable to perform ROS: Mental status change  Cardiovascular: Positive for chest pain.    Physical Exam Updated Vital Signs BP (!) 84/57   Pulse (!) 57   Temp 98.5 F (36.9 C)   Resp 18   SpO2 100%   Physical Exam Vitals and nursing note reviewed.  Constitutional:      General: She is not in acute distress.    Appearance: She is well-developed and well-nourished. She is not diaphoretic.  HENT:     Head: Normocephalic and atraumatic.  Eyes:     Extraocular Movements: EOM normal.     Pupils: Pupils are equal, round, and reactive to light.   Cardiovascular:     Rate and Rhythm: Normal rate and regular rhythm.     Heart sounds: No murmur heard. No friction rub. No gallop.   Pulmonary:     Effort: Pulmonary effort is normal.     Breath sounds: No wheezing or rales.  Abdominal:     General: There is no distension.     Palpations: Abdomen is soft.     Tenderness: There is no abdominal tenderness.  Musculoskeletal:        General: No tenderness or edema.     Cervical back: Normal range of motion and neck supple.  Skin:    General: Skin is warm and dry.  Neurological:     Mental Status: She is alert.     Comments: Awakens to voice  Psychiatric:        Mood and Affect: Mood and affect normal.        Behavior: Behavior normal.     ED Results / Procedures / Treatments   Labs (all labs ordered are listed, but only abnormal results are displayed)  Labs Reviewed  BASIC METABOLIC PANEL - Abnormal; Notable for the following components:      Result Value   Potassium 3.4 (*)    Glucose, Bld 100 (*)    All other components within normal limits  CBC  I-STAT BETA HCG BLOOD, ED ([facility], [facility], [facility] ONLY)  TROPONIN I (HIGH SENSITIVITY)    EKG EKG Interpretation  Date/Time:  Wednesday October 31 2020 01:32:57 EST Ventricular Rate:  69 PR Interval:    QRS Duration: 90 QT Interval:  434 QTC Calculation: 465 R Axis:   77 Text Interpretation: Sinus rhythm RSR' in V1 or V2, probably normal variant No significant change since last tracing Confirmed by Melene Plan [id number]) on 10/31/2020 1:50:09 AM   Radiology DG Chest Port 1 View  Result Date: 10/31/2020 CLINICAL DATA:  Chest pain. EXAM: PORTABLE CHEST 1 VIEW COMPARISON:  May 09, 2017 FINDINGS: The heart size and mediastinal contours are within normal limits. Both lungs are clear. The visualized skeletal structures are unremarkable. IMPRESSION: No active disease. Electronically Signed   By: Katherine Mantle M.D.   On: 10/31/2020 01:25    Procedures Procedures (including critical  care time)  Medications Ordered in ED Medications - No data to display  ED Course  I have reviewed the triage vital signs and the nursing notes.  Pertinent labs & imaging results that were available during my care of the patient were reviewed by me and considered in my medical decision making (see chart for details).    MDM Rules/Calculators/A&P                          30 yo F with a significant past medical history of polysubstance abuse comes in with a chief complaints of chest pain. Patient is unable to provide much history because she is so drowsy. She does not specifically endorse any illegal drugs though its the most likely cause of her presentation. She is unable to provide me much information about her chest pain but she is too intoxicated to be discharged at this time. We will obtain a chest x-ray to assess for aspiration. Observe in the ED. EKG.  Reassessed and patient still not awake enough to safely be discharged.  Will continue to metabolize.   Signed out to Dr. Bernette Mayers, please see his note for further details of care in the ED.   The patients results and plan were reviewed and discussed.   Any x-rays performed were independently reviewed by myself.   Differential diagnosis were considered with the presenting HPI.  Medications - No data to display  Vitals:   10/31/20 0330 10/31/20 0345 10/31/20 0415 10/31/20 0630  BP: (!) 86/56 (!) 84/55 (!) 85/55 (!) 84/57  Pulse: (!) 52 (!) 52 (!) 53 (!) 57  Resp: 15 15 13 18   Temp:    98.5 F (36.9 C)  TempSrc:      SpO2: 99% 100% 99% 100%    Final diagnoses:  Polysubstance abuse (HCC)    Admission/ observation were discussed with the admitting physician, patient and/or family and they are comfortable with the plan.   Final Clinical Impression(s) / ED Diagnoses Final diagnoses:  Polysubstance abuse [facility])    Rx / DC Orders ED Discharge Orders    None       [facility], DO 10/31/20 12/29/20

## 2020-10-31 NOTE — ED Triage Notes (Signed)
Pt c/o chest pain that started 2 hours pta, denies shortness of breath. Pt drowsy and falling asleep during triage, easy to arouse.

## 2020-10-31 NOTE — ED Provider Notes (Signed)
Care of the patient assumed at the change of shift. History of substance abuse and too sleep for discharge.   11:22 AM She is still sleeping. BP has been borderline but she is sitting up and leaning over to the side. I attempted to wake the patient up and she opened her eyes and then quickly closed them again when she saw me. No additional ED workup is necessary. Will discharge home.    Pollyann Savoy, MD 10/31/20 [phone]

## 2020-10-31 NOTE — ED Notes (Signed)
Pt d/c home per MD order. Discharge summary reviewed with pt, pt verbalizes understanding , but interested. Pt called for a ride home. Ambulatory off unit. No s/s of acute distress noted. Ambulatory off unit.

## 2020-10-31 NOTE — ED Notes (Signed)
Morrie Sheldon - RN aware of pt's BP.

## 2021-08-20 ENCOUNTER — Other Ambulatory Visit: Payer: Self-pay

## 2021-08-20 ENCOUNTER — Emergency Department (HOSPITAL_BASED_OUTPATIENT_CLINIC_OR_DEPARTMENT_OTHER)
Admission: EM | Admit: 2021-08-20 | Discharge: 2021-08-21 | Disposition: A | Discharge status: Home / Self Care | Payer: Self-pay | Attending: Emergency Medicine | Admitting: Emergency Medicine

## 2021-08-20 ENCOUNTER — Encounter (HOSPITAL_BASED_OUTPATIENT_CLINIC_OR_DEPARTMENT_OTHER): Payer: Self-pay

## 2021-08-20 ENCOUNTER — Emergency Department (HOSPITAL_BASED_OUTPATIENT_CLINIC_OR_DEPARTMENT_OTHER): Payer: Self-pay

## 2021-08-20 DIAGNOSIS — R591 Generalized enlarged lymph nodes: Secondary | ICD-10-CM | POA: Insufficient documentation

## 2021-08-20 DIAGNOSIS — R590 Localized enlarged lymph nodes: Secondary | ICD-10-CM

## 2021-08-20 DIAGNOSIS — N762 Acute vulvitis: Secondary | ICD-10-CM | POA: Insufficient documentation

## 2021-08-20 DIAGNOSIS — F17210 Nicotine dependence, cigarettes, uncomplicated: Secondary | ICD-10-CM | POA: Insufficient documentation

## 2021-08-20 LAB — URINALYSIS, MICROSCOPIC (REFLEX): RBC / HPF: NONE SEEN RBC/hpf (ref 0–5)

## 2021-08-20 LAB — CBC WITH DIFFERENTIAL/PLATELET
Abs Immature Granulocytes: 0.02 10*3/uL (ref 0.00–0.07)
Basophils Absolute: 0 10*3/uL (ref 0.0–0.1)
Basophils Relative: 1 %
Eosinophils Absolute: 0.2 10*3/uL (ref 0.0–0.5)
Eosinophils Relative: 3 %
HCT: 39.9 % (ref 36.0–46.0)
Hemoglobin: 12.8 g/dL (ref 12.0–15.0)
Immature Granulocytes: 0 %
Lymphocytes Relative: 28 %
Lymphs Abs: 2 10*3/uL (ref 0.7–4.0)
MCH: 27.8 pg (ref 26.0–34.0)
MCHC: 32.1 g/dL (ref 30.0–36.0)
MCV: 86.6 fL (ref 80.0–100.0)
Monocytes Absolute: 0.7 10*3/uL (ref 0.1–1.0)
Monocytes Relative: 9 %
Neutro Abs: 4.2 10*3/uL (ref 1.7–7.7)
Neutrophils Relative %: 59 %
Platelets: 369 10*3/uL (ref 150–400)
RBC: 4.61 MIL/uL (ref 3.87–5.11)
RDW: 15 % (ref 11.5–15.5)
WBC: 7.2 10*3/uL (ref 4.0–10.5)
nRBC: 0 % (ref 0.0–0.2)

## 2021-08-20 LAB — COMPREHENSIVE METABOLIC PANEL
ALT: 17 U/L (ref 0–44)
AST: 24 U/L (ref 15–41)
Albumin: 3.6 g/dL (ref 3.5–5.0)
Alkaline Phosphatase: 78 U/L (ref 38–126)
Anion gap: 9 (ref 5–15)
BUN: 19 mg/dL (ref 6–20)
CO2: 31 mmol/L (ref 22–32)
Calcium: 9.1 mg/dL (ref 8.9–10.3)
Chloride: 95 mmol/L — ABNORMAL LOW (ref 98–111)
Creatinine, Ser: 0.79 mg/dL (ref 0.44–1.00)
GFR, Estimated: >60 mL/min (ref >60)
Glucose, Bld: 101 mg/dL — ABNORMAL HIGH (ref 70–99)
Potassium: 4.2 mmol/L (ref 3.5–5.1)
Sodium: 135 mmol/L (ref 135–145)
Total Bilirubin: 0.1 mg/dL — ABNORMAL LOW (ref 0.3–1.2)
Total Protein: 8.3 g/dL — ABNORMAL HIGH (ref 6.5–8.1)

## 2021-08-20 LAB — PREGNANCY, URINE: Preg Test, Ur: NEGATIVE

## 2021-08-20 LAB — URINALYSIS, ROUTINE W REFLEX MICROSCOPIC
Bilirubin Urine: NEGATIVE
Glucose, UA: NEGATIVE mg/dL
Hgb urine dipstick: NEGATIVE
Ketones, ur: NEGATIVE mg/dL
Leukocytes,Ua: NEGATIVE
Nitrite: NEGATIVE
Protein, ur: 30 mg/dL — AB
Specific Gravity, Urine: >1.03 — ABNORMAL HIGH (ref 1.005–1.030)
pH: 6 (ref 5.0–8.0)

## 2021-08-20 LAB — LACTIC ACID, PLASMA: Lactic Acid, Venous: 0.8 mmol/L (ref 0.5–1.9)

## 2021-08-20 MED ORDER — IOHEXOL 300 MG/ML  SOLN
100.0000 mL | Freq: Once | INTRAMUSCULAR | Status: AC | PRN
  Administered 2021-08-20: 100 mL via INTRAVENOUS

## 2021-08-20 MED ORDER — CLINDAMYCIN PHOSPHATE 300 MG/50ML IV SOLN
300.0000 mg | Freq: Once | INTRAVENOUS | Status: AC
  Administered 2021-08-20: 300 mg via INTRAVENOUS
  Filled 2021-08-20: qty 50

## 2021-08-20 MED ORDER — CLINDAMYCIN HCL 150 MG PO CAPS: 300.0000 mg | ORAL_CAPSULE | Freq: Three times a day (TID) | ORAL | 0 refills | Status: AC

## 2021-08-20 NOTE — ED Notes (Addendum)
This tech went to pt room to update vital signs and pt refused at this time. She said to come back later. Pt did accept a warm blanket.

## 2021-08-20 NOTE — ED Notes (Signed)
Asked by patient RN to attempt an IV for CT. I gathered all necessary supplies for an ultrasound IV and entered patient room. I introduced myself and explained why I was there. Patient immediately said NO, and voiced that she wanted someone who knew what they were doing. I left the room and told the patients RN.

## 2021-08-20 NOTE — ED Notes (Signed)
Pt came out of bathroom yelling and cursing at staff in hallway. Pt informed that abusive language and cursing would not be tolerated and she needs to return to her room.

## 2021-08-20 NOTE — ED Notes (Signed)
ED Provider at bedside. 

## 2021-08-20 NOTE — ED Triage Notes (Signed)
Pt states she cut her [place] with a [place] 4 [place], states has gotten infected and is having irritation & pus like discharge. States is homeless and does IV heroin/crack.

## 2021-08-20 NOTE — ED Notes (Signed)
Pt left dept and was found on other sided of building in bathroom attempting to smoke. Pt escorted back to her room.

## 2021-08-20 NOTE — ED Notes (Signed)
Pt. In [place] Room for almost 45 mins.  Pt. Was asked by RN to come back to room so EDP could see her.

## 2021-08-20 NOTE — ED Notes (Signed)
Pt. In restroom multiple times this night.  RNs attempt to get Pt. Out of rest room after Pt. Spent 25 to 40 minutes at at time in the restroom.  Pt. Had IV in place last visit to the Restroom.  Pt. In room dancing and weaving back and forth last time she was in restroom.  Pt. Very hard to deal with at times after her last visit to the rest room.    Pt. Finally asleep before abx was started.  Pt. Was given meal as asked and she ate very little.  Pt. Had friend at bedside who was very kind and good to her.  RN attempt to deal with Pt. In every easy way she could however Pt. Continued to try and smoke her cigg.  RN explained that she couldn't.   After the IV abx finished RN attempted to pull Pt. IV and Pt. Became very belligerent trying to fight RN and yelling and cursing at [employer].  Pt. Then got completely naked and placed herself in the floor screaming till security was called.  Pt. Was asked several times to dress and calm down.  Pt. IV was removed by RN before all of this took place.  Pt. Did not allow vital signs to be taken.

## 2021-08-20 NOTE — ED Provider Notes (Signed)
[facility] EMERGENCY DEPARTMENT Provider Note   CSN: [id number] Arrival date & time: 08/20/21  1622     History Chief Complaint  Patient presents with   Vaginal Discharge    Candice Barton is a 30 y.o. female presenting for vaginal pain.  Patient states that the past 2 days she has had increased pain and swelling of her left vulva.  She states when she squeezes she can get up pus to come out.  She also reports swelling of her inguinal region.  She denies fevers, chills, nausea, vomiting.  She reports a history of polysubstance abuse.  She is not currently on any antibiotics.  She has not taken anything for her symptoms.  HPI     History reviewed. No pertinent past medical history.  There are no problems to display for this patient.   History reviewed. No pertinent surgical history.   OB History     Gravida  4   Para  1   Term  1   Preterm      AB  2   Living  1      SAB  1   IAB      Ectopic      Multiple      Live Births  1           History reviewed. No pertinent family history.  Social History   Tobacco Use   Smoking status: Every Day    Types: Cigarettes   Smokeless tobacco: Never  Vaping Use   Vaping Use: Every day  Substance Use Topics   Alcohol use: Yes   Drug use: Yes    Types: Heroin, IV, Cocaine    Comment: fentanyl    Home Medications Prior to Admission medications   Medication Sig Start Date End Date Taking? Authorizing Provider  clindamycin (CLEOCIN) 150 MG capsule Take 2 capsules (300 mg total) by mouth 3 (three) times daily for 10 days. 08/20/21 08/30/21 Yes Ashton Sabine, PA-C  nitrofurantoin, macrocrystal-monohydrate, (MACROBID) 100 MG capsule Take 1 capsule (100 mg total) by mouth 2 (two) times daily. 05/01/16   Armando Reichert, CNM    Allergies    Nsaids  Review of Systems   Review of Systems  Genitourinary:  Positive for vaginal pain.  All other systems reviewed and are negative.  Physical  Exam Updated Vital Signs BP 112/82 (BP Location: Left Arm)   Pulse 91   Temp 98.4 F (36.9 C) (Oral)   Resp 18   Ht 5\' 6"  (1.676 m)   Wt 55.2 kg   SpO2 99%   BMI 19.66 kg/m   Physical Exam Vitals and nursing note reviewed.  Constitutional:      General: She is not in acute distress.    Appearance: Normal appearance.  HENT:     Head: Normocephalic and atraumatic.  Eyes:     Conjunctiva/sclera: Conjunctivae normal.     Pupils: Pupils are equal, round, and reactive to light.  Cardiovascular:     Rate and Rhythm: Normal rate and regular rhythm.     Pulses: Normal pulses.  Pulmonary:     Effort: Pulmonary effort is normal. No respiratory distress.     Breath sounds: Normal breath sounds. No wheezing.     Comments: Speaking in full sentences.  Clear lung sounds in all fields. Abdominal:     General: There is no distension.     Palpations: Abdomen is soft. There is no mass.     Tenderness:  There is no abdominal tenderness. There is no guarding or rebound.  Genitourinary:    Labia:        Left: Tenderness present.        Comments: Erythema, swelling, induration, and tenderness.  No obvious fluctuance. Bilateral inguinal lymphadenopathy, worse on the left side. Musculoskeletal:        General: Normal range of motion.     Cervical back: Normal range of motion and neck supple.  Lymphadenopathy:     Lower Body: Right inguinal adenopathy present. Left inguinal adenopathy present.  Skin:    General: Skin is warm and dry.     Capillary Refill: Capillary refill takes less than 2 seconds.  Neurological:     Mental Status: She is alert and oriented to person, place, and time.  Psychiatric:        Mood and Affect: Mood and affect normal.        Speech: Speech normal.        Behavior: Behavior normal.    ED Results / Procedures / Treatments   Labs (all labs ordered are listed, but only abnormal results are displayed) Labs Reviewed  COMPREHENSIVE METABOLIC PANEL - Abnormal;  Notable for the following components:      Result Value   Chloride 95 (*)    Glucose, Bld 101 (*)    Total Protein 8.3 (*)    Total Bilirubin 0.1 (*)    All other components within normal limits  URINALYSIS, ROUTINE W REFLEX MICROSCOPIC - Abnormal; Notable for the following components:   Specific Gravity, Urine >1.030 (*)    Protein, ur 30 (*)    All other components within normal limits  URINALYSIS, MICROSCOPIC (REFLEX) - Abnormal; Notable for the following components:   Bacteria, UA RARE (*)    All other components within normal limits  LACTIC ACID, PLASMA  CBC WITH DIFFERENTIAL/PLATELET  PREGNANCY, URINE  LACTIC ACID, PLASMA  RPR  HIV ANTIBODY (ROUTINE TESTING W REFLEX)  GC/CHLAMYDIA PROBE AMP ([place]) NOT AT [facility]    EKG None  Radiology CT ABDOMEN PELVIS W CONTRAST  Result Date: 08/20/2021 CLINICAL DATA:  Inguinal and pelvic lymphadenopathy. EXAM: CT ABDOMEN AND PELVIS WITH CONTRAST TECHNIQUE: Multidetector CT imaging of the abdomen and pelvis was performed using the standard protocol following bolus administration of intravenous contrast. CONTRAST:  OMNIPAQUE IOHEXOL 300 MG/ML  SOLN COMPARISON:  None. FINDINGS: Lower chest: There is minimal atelectasis in the left lung base. Hepatobiliary: There is a rounded hypodensity in the right lobe of the liver which is too small to characterize, likely a cyst or hemangioma. The liver is mildly enlarged. The gallbladder appears within normal limits. Common bile duct appears dilated measuring 8 mm. Pancreas: Unremarkable. No pancreatic ductal dilatation or surrounding inflammatory changes. Spleen: Normal in size without focal abnormality. Adrenals/Urinary Tract: There is mild diffuse bladder wall thickening versus normal under distension. The kidneys and adrenal glands are within normal limits. Stomach/Bowel: Stomach is within normal limits. Appendix is not visualized. No evidence of bowel wall thickening, distention, or inflammatory  changes. Vascular/Lymphatic: There is bilateral inguinal lymphadenopathy. The largest lymph node is in the left inguinal region measuring 3.8 x 1.9 cm. The largest lymph node in the right inguinal region measures 2.1 x 1.1 cm. No other enlarged lymph nodes are seen. Vasculature is within normal limits. Reproductive: Uterus and bilateral adnexa are unremarkable. Other: There is no ascites or free air. There is no focal abdominal wall hernia. There is some fat stranding in the  region of the mons pubis and left labia majora. No enhancing fluid collections are identified. Musculoskeletal: There is chronic appearing fragmentation of the L1 vertebral body. This may be posttraumatic or related to congenital anomaly. No acute fractures are seen. IMPRESSION: 1. There is stranding and edema in the soft tissues of the mons pubis and left labia majora. Please correlate clinically for cellulitis. There is no evidence for abscess. 2. There is bilateral inguinal lymphadenopathy, left greater than right. Recommend clinical correlation and follow-up. 3. There is mild common bile duct dilatation. Correlation with lab values recommended. Consider follow-up ultrasound. Electronically Signed   By: Darliss Cheney M.D.   On: 08/20/2021 22:17    Procedures Ultrasound ED Peripheral IV (Provider)  Date/Time: 08/20/2021 9:33 PM Performed by: Alveria Apley, PA-C Authorized by: Alveria Apley, PA-C   Procedure details:    Indications: multiple failed IV attempts and poor IV access     Skin Prep: isopropyl alcohol     Location: L upper arm.   Angiocath:  20 G   Bedside Ultrasound Guided: Yes     Images: not archived     Patient tolerated procedure without complications: Yes     Dressing applied: Yes     Medications Ordered in ED Medications  iohexol (OMNIPAQUE) 300 MG/ML solution 100 mL (100 mLs Intravenous Contrast Given 08/20/21 2147)  clindamycin (CLEOCIN) IVPB 300 mg (0 mg Intravenous Stopped 08/20/21 2339)    ED  Course  I have reviewed the triage vital signs and the nursing notes.  Pertinent labs & imaging results that were available during my care of the patient were reviewed by me and considered in my medical decision making (see chart for details).    MDM Rules/Calculators/A&P                           Patient presenting for evaluation of vaginal pain.  On exam, patient has a swollen, erythematous, and tender left outer labia/mons.  She has significant inguinal lymphadenopathy, especially in the left side.  However no fevers, nausea, vomit, abdominal pain.  Doubt sepsis.  Labs obtained from triage interpreted by me, overall reassuring.  No leukocytosis.  Lactic is negative.  Electrolytes stable.  Patient will need a CT scan for further evaluation to rule out deep space infection.  While she is having some purulent drainage, there is no obvious fluctuance.  CT shows significant cellulitis and inguinal lymphadenopathy, but no abscess.  Discussed with patient.  Discussed treatment with antibiotics.  Discussed follow-up with OB/GYN, resources given.  At this time, patient appears safe for discharge.  Return precautions given.  Patient states she understands.  Final Clinical Impression(s) / ED Diagnoses Final diagnoses:  Vulval cellulitis  Inguinal lymphadenopathy    Rx / DC Orders ED Discharge Orders          Ordered    clindamycin (CLEOCIN) 150 MG capsule  3 times daily        08/20/21 2241             Alveria Apley, PA-C 08/21/21 0018    Alvira Monday, MD 08/21/21 2257

## 2021-08-20 NOTE — ED Notes (Signed)
Pt refused to have blood drawn by RT using [place] machine. PA made aware.

## 2021-08-20 NOTE — ED Notes (Signed)
Pt in bathroom for extended amount of time again. Knocked on door and refuses to come out. PA aware.

## 2021-08-20 NOTE — ED Notes (Signed)
Pt. Reports to RN she is a IV drug user and is homeless without being able to get the meds she needs and the help she may need for the abscess she has on her labia and in her vagina.  Pt. Reports she is using peroxide for the wounds.

## 2021-08-20 NOTE — Discharge Instructions (Signed)
Your work-up today showed cellulitis, which is a skin infection.  There is no abscess or pocket infection. You also have swollen lymph nodes due to the skin infection.  As we treat the infection, this will help the lymph nodes. Use Tylenol and ibuprofen to help with swollen lymph node discomfort. Take antibiotics as prescribed.  Take entire course, even if symptoms improve. Follow-up with an OB/GYN for further evaluation of your symptoms.  You may call the clinic below to set up a follow-up appointment. Return to the emergency room if you develop high fevers, weakness and confusion, severe worsening pain, or any new or worsening, or concerning symptoms.

## 2021-08-20 NOTE — ED Notes (Signed)
Pt. Telling RN where to start the IV.  Pt. Told RN to pull the IV when IV was attempted.  Pt. Said the RN was a crappy IV starter and she was no intentionally trying to be mean.  RN accepted the compliment and told Pt. She would get someone else to start an IV for her.  Pt. No very modest with her naked behavior and when given a cover the Pt. Said she did not need one. RN allowed Pt. To stay uncovered as she wanted.

## 2021-08-21 LAB — RPR
RPR Ser Ql: REACTIVE — AB
RPR Titer: 1:128 {titer}

## 2021-08-21 LAB — HIV ANTIBODY (ROUTINE TESTING W REFLEX): HIV Screen 4th Generation wRfx: NONREACTIVE

## 2021-08-22 LAB — T.PALLIDUM AB, TOTAL: T Pallidum Abs: REACTIVE — AB

## 2022-03-02 ENCOUNTER — Emergency Department (HOSPITAL_COMMUNITY)
Admission: EM | Admit: 2022-03-02 | Discharge: 2022-03-02 | Discharge status: Against Medical Advice | Payer: Self-pay | Source: Home / Self Care

## 2022-03-02 ENCOUNTER — Emergency Department (HOSPITAL_COMMUNITY)
Admission: EM | Admit: 2022-03-02 | Discharge: 2022-03-02 | Discharge status: Against Medical Advice | Payer: Self-pay | Attending: Emergency Medicine | Admitting: Emergency Medicine

## 2022-03-02 ENCOUNTER — Encounter (HOSPITAL_COMMUNITY): Payer: Self-pay | Admitting: Emergency Medicine

## 2022-03-02 DIAGNOSIS — L0291 Cutaneous abscess, unspecified: Secondary | ICD-10-CM

## 2022-03-02 DIAGNOSIS — L02414 Cutaneous abscess of left upper limb: Secondary | ICD-10-CM | POA: Insufficient documentation

## 2022-03-02 DIAGNOSIS — Z5321 Procedure and treatment not carried out due to patient leaving prior to being seen by health care provider: Secondary | ICD-10-CM | POA: Insufficient documentation

## 2022-03-02 HISTORY — DX: Unspecified viral hepatitis C without hepatic coma: B19.20

## 2022-03-02 MED ORDER — TETANUS-DIPHTHERIA TOXOIDS TD 5-2 LFU IM INJ
0.5000 mL | INJECTION | Freq: Once | INTRAMUSCULAR | Status: DC
  Filled 2022-03-02: qty 0.5

## 2022-03-02 MED ORDER — LIDOCAINE-EPINEPHRINE (PF) 2 %-1:200000 IJ SOLN
INTRAMUSCULAR | Status: AC
  Filled 2022-03-02: qty 20

## 2022-03-02 MED ORDER — LIDOCAINE HCL 2 % IJ SOLN
INTRAMUSCULAR | Status: AC
  Filled 2022-03-02: qty 20

## 2022-03-02 MED ORDER — LIDOCAINE HCL (PF) 2 % IJ SOLN
10.0000 mL | Freq: Once | INTRAMUSCULAR | Status: DC
  Filled 2022-03-02: qty 10

## 2022-03-02 NOTE — ED Triage Notes (Addendum)
Pt reports an abscess to her L shoulder from IM fentanyl use. Also requesting detox. States that she has used crack and fentanyl today. Reports hx of hep C. ?

## 2022-03-02 NOTE — ED Provider Notes (Signed)
?[facility]-EMERGENCY DEPT ?Provider Note ? ? ?CSN: [id number] ?Arrival date & time: 03/02/22  2029 ? ?  ? ?History ? ?Chief Complaint  ?Patient presents with  ? Abscess  ? Drug Problem  ? ? ?Candice Barton is a 31 y.o. female. ? ?31 year old female with history of opiate addiction presents with left shoulder abscess.  Patient states that she was skin popping fentanyl in that area having some of the other stuff in there.  Denies any fever or chills.  Describes sharp pain to the area.  Was recently at another facility and was in detox but left AMA. ? ? ?  ? ?Home Medications ?Prior to Admission medications   ?Medication Sig Start Date End Date Taking? Authorizing Provider  ?nitrofurantoin, macrocrystal-monohydrate, (MACROBID) 100 MG capsule Take 1 capsule (100 mg total) by mouth 2 (two) times daily. 05/01/16   Armando Reichert, CNM  ?   ? ?Allergies    ?Nsaids   ? ?Review of Systems   ?Review of Systems  ?All other systems reviewed and are negative. ? ?Physical Exam ?Updated Vital Signs ?BP 127/87 (BP Location: Left Arm)   Pulse (!) 106   Temp 98.1 ?F (36.7 ?C) (Oral)   Resp 16   SpO2 100%  ?Physical Exam ?Vitals and nursing note reviewed.  ?Constitutional:   ?   General: She is not in acute distress. ?   Appearance: Normal appearance. She is well-developed. She is not toxic-appearing.  ?HENT:  ?   Head: Normocephalic and atraumatic.  ?Eyes:  ?   General: Lids are normal.  ?   Conjunctiva/sclera: Conjunctivae normal.  ?   Pupils: Pupils are equal, round, and reactive to light.  ?Neck:  ?   Thyroid: No thyroid mass.  ?   Trachea: No tracheal deviation.  ?Cardiovascular:  ?   Rate and Rhythm: Normal rate and regular rhythm.  ?   Heart sounds: Normal heart sounds. No murmur heard. ?  No gallop.  ?Pulmonary:  ?   Effort: Pulmonary effort is normal. No respiratory distress.  ?   Breath sounds: Normal breath sounds. No stridor. No decreased breath sounds, wheezing, rhonchi or rales.  ?Abdominal:  ?    General: There is no distension.  ?   Palpations: Abdomen is soft.  ?   Tenderness: There is no abdominal tenderness. There is no rebound.  ?Musculoskeletal:     ?   General: No tenderness. Normal range of motion.  ?     Arms: ? ?   Cervical back: Normal range of motion and neck supple.  ?Skin: ?   General: Skin is warm and dry.  ?   Findings: No abrasion or rash.  ?Neurological:  ?   Mental Status: She is alert and oriented to person, place, and time. Mental status is at baseline.  ?   GCS: GCS eye subscore is 4. GCS verbal subscore is 5. GCS motor subscore is 6.  ?   Cranial Nerves: No cranial nerve deficit.  ?   Sensory: No sensory deficit.  ?   Motor: Motor function is intact.  ?Psychiatric:     ?   Attention and Perception: Attention normal.     ?   Speech: Speech normal.     ?   Behavior: Behavior normal.  ? ? ?ED Results / Procedures / Treatments   ?Labs ?(all labs ordered are listed, but only abnormal results are displayed) ?Labs Reviewed - No data to display ? ?EKG ?None ? ?  Radiology ?No results found. ? ?Procedures ?Procedures  ? ? ?Medications Ordered in ED ?Medications  ?lidocaine HCl (PF) (XYLOCAINE) 2 % injection 10 mL (has no administration in time range)  ?tetanus & diphtheria toxoids (adult) (TENIVAC) injection 0.5 mL (has no administration in time range)  ?lidocaine-EPINEPHrine (XYLOCAINE W/EPI) 2 %-1:200000 (PF) injection (has no administration in time range)  ?lidocaine (XYLOCAINE) 2 % (with pres) injection (has no administration in time range)  ? ? ?ED Course/ Medical Decision Making/ A&P ?  ?                        ?Medical Decision Making ?Risk ?Prescription drug management. ? ? ?Discussed with patient plan that she will require I&D of her left shoulder abscess.  While I was getting my supplies together patient eloped ? ? ? ? ? ? ? ?Final Clinical Impression(s) / ED Diagnoses ?Final diagnoses:  ?Abscess  ? ? ?Rx / DC Orders ?ED Discharge Orders   ? ? None  ? ?  ? ? ?  ?Lorre Nick,  MD ?03/02/22 2109 ? ?

## 2022-03-02 NOTE — ED Notes (Signed)
Pt walked out of room, states she is leaving, pt informed MD is coming to I&D abscess.  Pt walked out.  MD aware. ?

## 2022-07-25 IMAGING — CT CT ABD-PELV W/ CM
2 of 5 series · 15 of 46 positions shown, 17 images · IV contrast (Omnipaque)
Comparison: None.

CLINICAL DATA: Inguinal and pelvic lymphadenopathy.

EXAM:
CT ABDOMEN AND PELVIS WITH CONTRAST
TECHNIQUE: Multidetector CT imaging of the abdomen and pelvis was performed
using the standard protocol following bolus administration of
intravenous contrast.
CONTRAST:  100mL OMNIPAQUE IOHEXOL 300 MG/ML  SOLN

[Series 2: axial st · axial · 0.71mm/px · z∈[+708,+1112]mm · 12 of 91 slices shown, 14 images]
[im 5/91  soft-tissue]
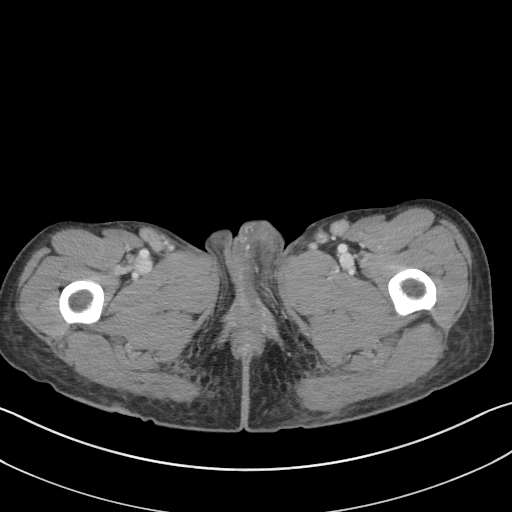
[im 5/91  bone]
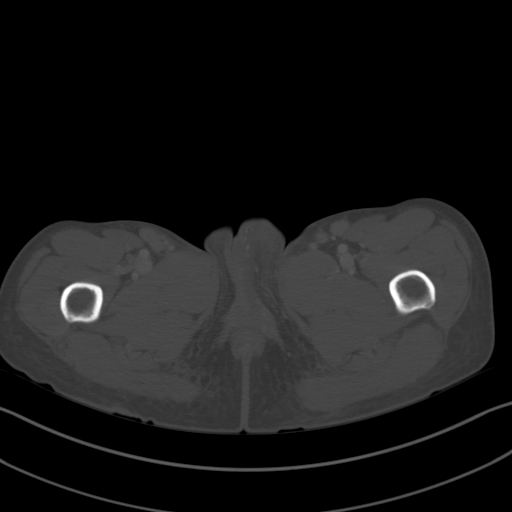
[im 13/91  soft-tissue]
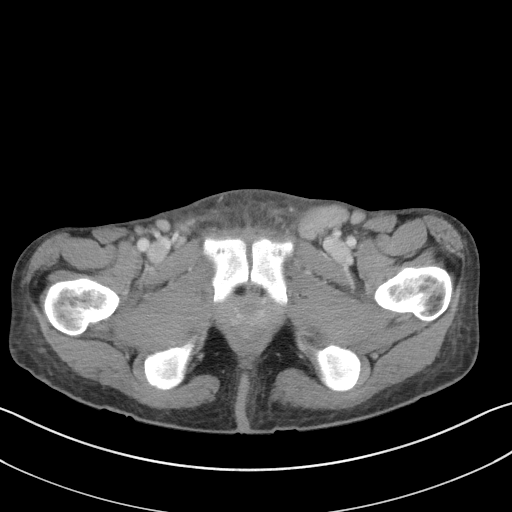
[im 22/91  soft-tissue]
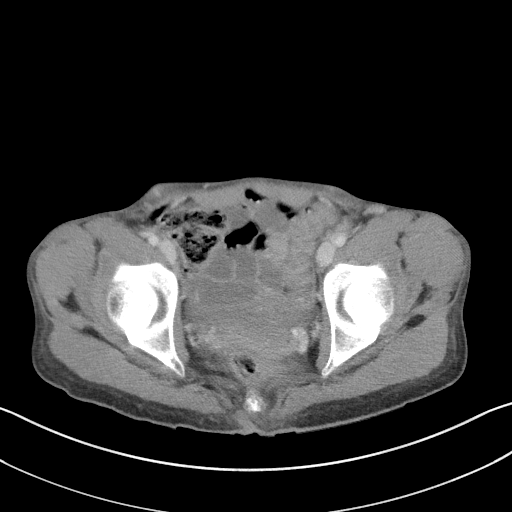
[im 26/91  soft-tissue]
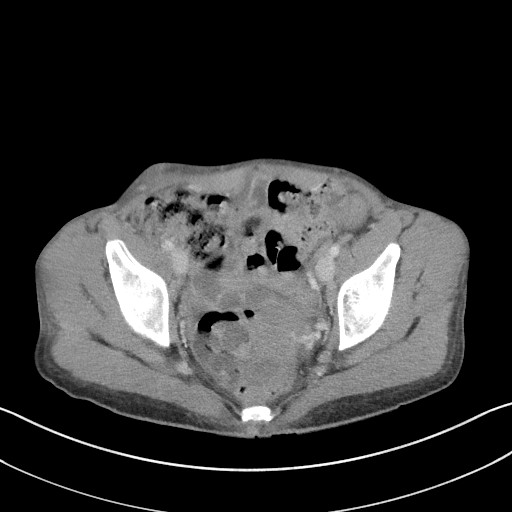
[im 35/91  soft-tissue]
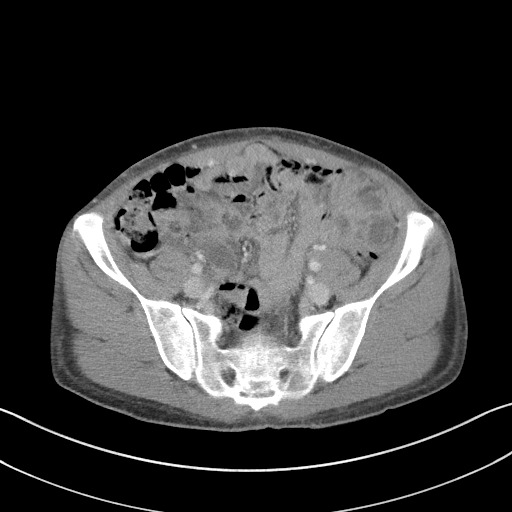
[im 43/91  soft-tissue]
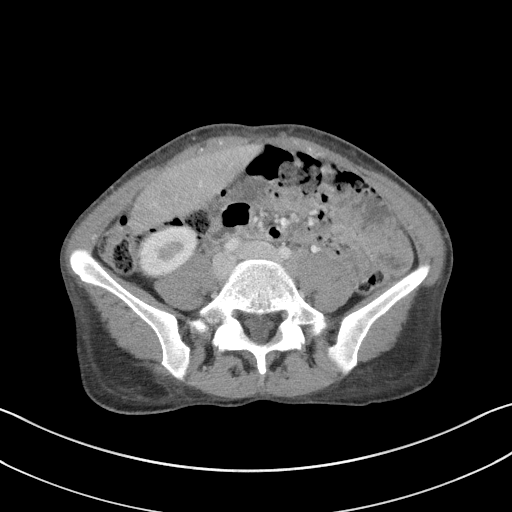
[im 48/91  soft-tissue]
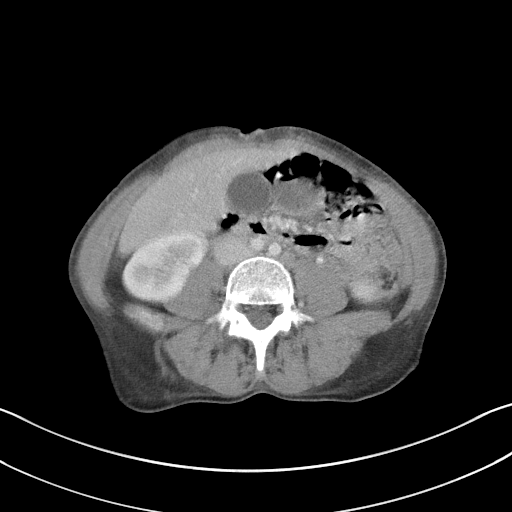
[im 56/91  soft-tissue]
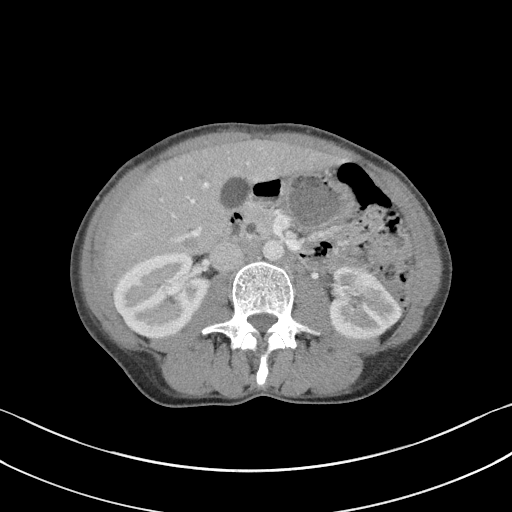
[im 65/91  soft-tissue]
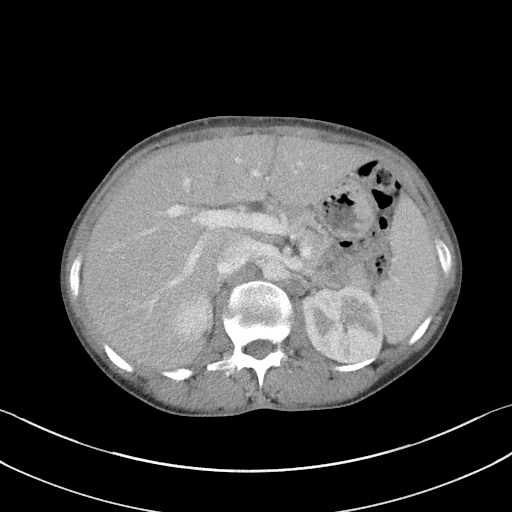
[im 65/91  bone]
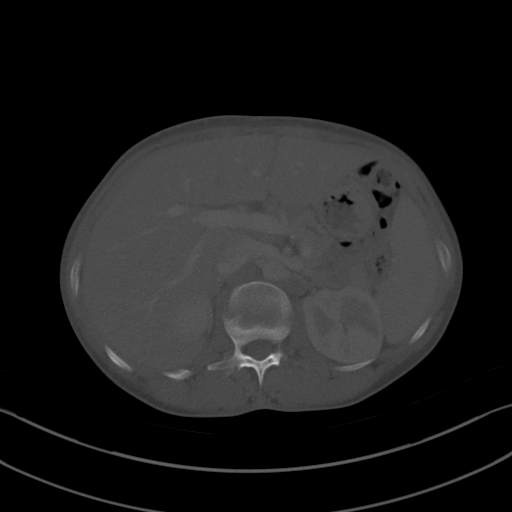
[im 69/91  soft-tissue]
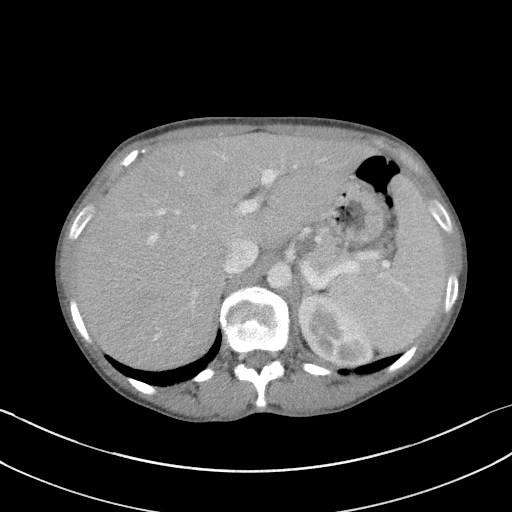
[im 78/91  soft-tissue]
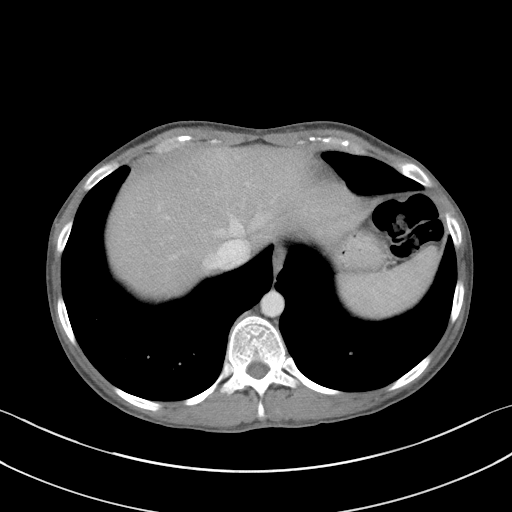
[im 86/91  soft-tissue]
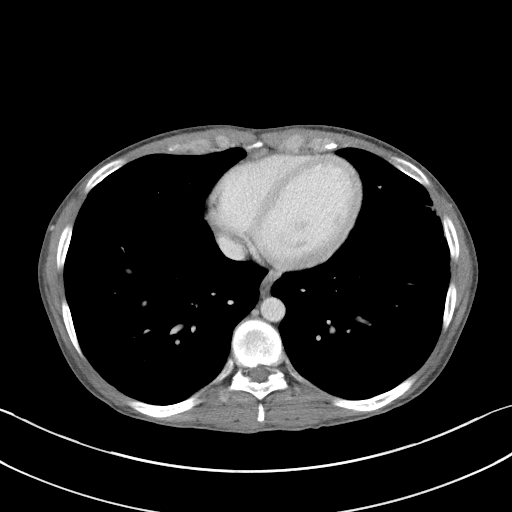

[Series 7: coronal st · coronal · 0.69mm/px · 3 of 96 slices shown]
[im 32/96  soft-tissue]
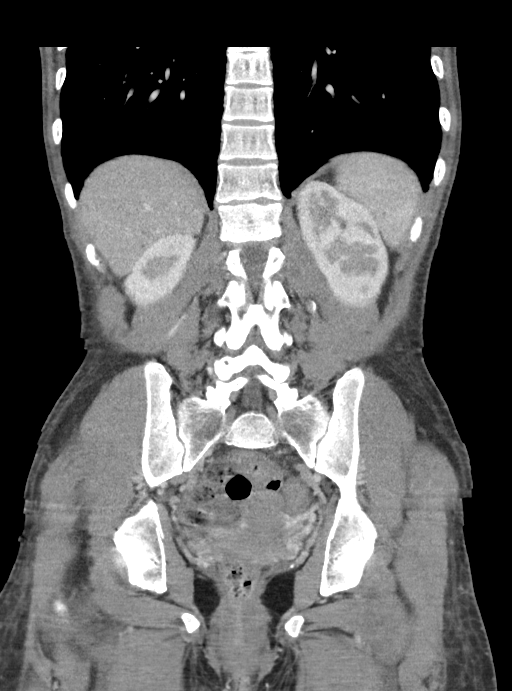
[im 43/96  soft-tissue]
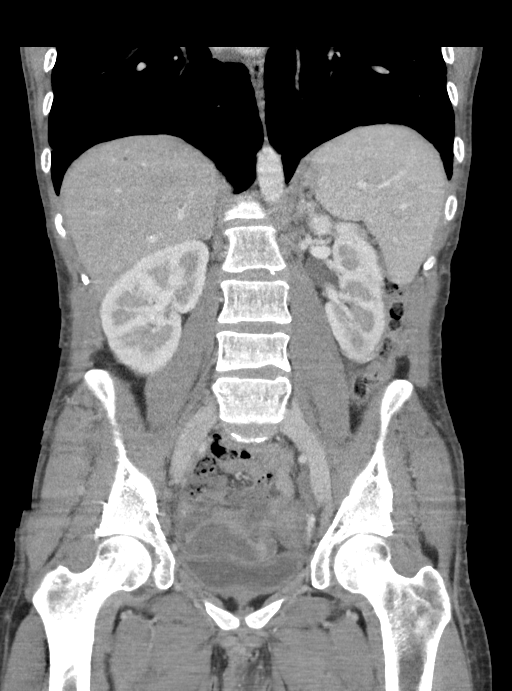
[im 53/96  soft-tissue]
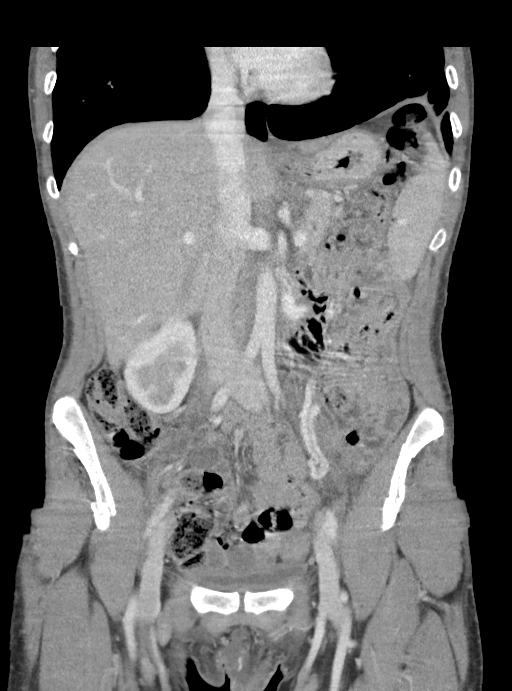

[15 of 46 positions shown; findings below may reference images not displayed]

FINDINGS: Lower chest: There is minimal atelectasis in the left lung base.

Hepatobiliary: There is a rounded hypodensity in the right lobe of
the liver which is too small to characterize, likely a cyst or
hemangioma. The liver is mildly enlarged. The gallbladder appears
within normal limits. Common bile duct appears dilated measuring 8
mm.

Pancreas: Unremarkable. No pancreatic ductal dilatation or
surrounding inflammatory changes.

Spleen: Normal in size without focal abnormality.

Adrenals/Urinary Tract: There is mild diffuse bladder wall
thickening versus normal under distension. The kidneys and adrenal
glands are within normal limits.

Stomach/Bowel: Stomach is within normal limits. Appendix is not
visualized. No evidence of bowel wall thickening, distention, or
inflammatory changes.

Vascular/Lymphatic: There is bilateral inguinal lymphadenopathy. The
largest lymph node is in the left inguinal region measuring 3.8 x
1.9 cm. The largest lymph node in the right inguinal region measures
2.1 x 1.1 cm. No other enlarged lymph nodes are seen. Vasculature is
within normal limits.

Reproductive: Uterus and bilateral adnexa are unremarkable.

Other: There is no ascites or free air. There is no focal abdominal
wall hernia. There is some fat stranding in the region of the mons
pubis and left labia majora. No enhancing fluid collections are
identified.

Musculoskeletal: There is chronic appearing fragmentation of the L1
vertebral body. This may be posttraumatic or related to congenital
anomaly. No acute fractures are seen.
IMPRESSION: 1. There is stranding and edema in the soft tissues of the mons
pubis and left labia majora. Please correlate clinically for
cellulitis. There is no evidence for abscess.
2. There is bilateral inguinal lymphadenopathy, left greater than
right. Recommend clinical correlation and follow-up.
3. There is mild common bile duct dilatation. Correlation with lab
values recommended. Consider follow-up ultrasound.
